# Patient Record
Sex: Male | Born: 1995 | Race: Black or African American | Hispanic: No | Marital: Single | Smoking: Current every day smoker
Health system: Southern US, Community
[De-identification: ages and names within clinical notes are randomized; demographics above are authoritative.]

---
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
## 2017-03-03 ENCOUNTER — Ambulatory Visit: Payer: Self-pay | Admitting: Family Medicine

## 2019-02-11 ENCOUNTER — Ambulatory Visit: Payer: Self-pay | Admitting: Family Medicine

## 2019-02-12 ENCOUNTER — Other Ambulatory Visit: Payer: Self-pay

## 2019-02-12 ENCOUNTER — Ambulatory Visit (INDEPENDENT_AMBULATORY_CARE_PROVIDER_SITE_OTHER): Payer: 59 | Admitting: Family Medicine

## 2019-02-12 ENCOUNTER — Encounter: Payer: Self-pay | Admitting: Family Medicine

## 2019-02-12 VITALS — BP 129/81 | HR 73 | Temp 98.2°F | Ht 74.0 in | Wt 296.0 lb

## 2019-02-12 DIAGNOSIS — Z7689 Persons encountering health services in other specified circumstances: Secondary | ICD-10-CM | POA: Diagnosis not present

## 2019-02-12 DIAGNOSIS — F5101 Primary insomnia: Secondary | ICD-10-CM

## 2019-02-12 DIAGNOSIS — Z23 Encounter for immunization: Secondary | ICD-10-CM

## 2019-02-12 NOTE — Progress Notes (Signed)
   BP 129/81   Pulse 73   Temp 98.2 F (36.8 C) (Oral)   Ht 6\' 2"  (1.88 m)   Wt 296 lb (134.3 kg)   SpO2 97%   BMI 38.00 kg/m    Subjective:    Patient ID: Steve Kim, male    DOB: 1995-05-28, 23 y.o.   MRN: [id number]  HPI: Steve Kim is a 23 y.o. male  Chief Complaint  Patient presents with  . Establish Care   Patient presenting today to establish care. Main concern today is requesting flu and tdap vaccines as he will be having a son here in February.   No known medical problems and not taking medications. Only concern he has is difficulty sleeping for more than a year. Struggles to fall asleep but typically stays asleep once he does. Feels like his mind races. Has not tried anything OTC. Naps sometimes, does not drink caffeine, tries to have a stable bedtime. No snoring, interrupted breathing episodes noted by bed partner.   Relevant past medical, surgical, family and social history reviewed and updated as indicated. Interim medical history since our last visit reviewed. Allergies and medications reviewed and updated.  Review of Systems  Per HPI unless specifically indicated above     Objective:    BP 129/81   Pulse 73   Temp 98.2 F (36.8 C) (Oral)   Ht 6\' 2"  (1.88 m)   Wt 296 lb (134.3 kg)   SpO2 97%   BMI 38.00 kg/m   Wt Readings from Last 3 Encounters:  02/12/19 296 lb (134.3 kg)    Physical Exam Vitals and nursing note reviewed.  Constitutional:      Appearance: Normal appearance.  HENT:     Head: Atraumatic.  Eyes:     Extraocular Movements: Extraocular movements intact.     Conjunctiva/sclera: Conjunctivae normal.  Cardiovascular:     Rate and Rhythm: Normal rate and regular rhythm.  Pulmonary:     Effort: Pulmonary effort is normal.     Breath sounds: Normal breath sounds.  Musculoskeletal:        General: Normal range of motion.     Cervical back: Normal range of motion and neck supple.  Skin:    General: Skin is warm and dry.   Neurological:     General: No focal deficit present.     Mental Status: He is oriented to person, place, and time.  Psychiatric:        Mood and Affect: Mood normal.        Thought Content: Thought content normal.        Judgment: Judgment normal.     No results found for this or any previous visit.    Assessment & Plan:   Problem List Items Addressed This Visit    None    Visit Diagnoses    Primary insomnia    -  Primary   Discussed sleep hygiene, melatonin, unisom prn. Pt wishes to avoid rx's right now and will try these conservative measures first   Encounter to establish care       Flu vaccine need       Relevant Orders   Flu Vaccine QUAD 6+ mos PF IM (Fluarix Quad PF) (Completed)   Need for Tdap vaccination       Relevant Orders   Tdap vaccine greater than or equal to 7yo IM (Completed)       Follow up plan: Return for CPE.
